# Patient Record
Sex: Male | Born: 1980 | Race: White | Hispanic: No | Marital: Single | Smoking: Current some day smoker
Health system: Southern US, Community
[De-identification: ages and names within clinical notes are randomized; demographics above are authoritative.]

## PROBLEM LIST (undated history)

## (undated) DIAGNOSIS — I10 Essential (primary) hypertension: Secondary | ICD-10-CM

---

## 2020-10-07 ENCOUNTER — Other Ambulatory Visit: Payer: Self-pay

## 2020-10-07 ENCOUNTER — Encounter: Payer: Self-pay | Admitting: Emergency Medicine

## 2020-10-07 ENCOUNTER — Ambulatory Visit
Admission: EM | Admit: 2020-10-07 | Discharge: 2020-10-07 | Disposition: A | Discharge status: Home / Self Care | Payer: 59 | Attending: Family Medicine | Admitting: Family Medicine

## 2020-10-07 DIAGNOSIS — I10 Essential (primary) hypertension: Secondary | ICD-10-CM | POA: Diagnosis not present

## 2020-10-07 DIAGNOSIS — M545 Low back pain, unspecified: Secondary | ICD-10-CM

## 2020-10-07 DIAGNOSIS — M5442 Lumbago with sciatica, left side: Secondary | ICD-10-CM

## 2020-10-07 HISTORY — DX: Essential (primary) hypertension: I10

## 2020-10-07 MED ORDER — CYCLOBENZAPRINE HCL 10 MG PO TABS: 10.0000 mg | ORAL_TABLET | Freq: Two times a day (BID) | ORAL | 0 refills | Status: AC | PRN

## 2020-10-07 MED ORDER — KETOROLAC TROMETHAMINE 30 MG/ML IJ SOLN
30.0000 mg | Freq: Once | INTRAMUSCULAR | Status: AC
  Administered 2020-10-07: 30 mg via INTRAMUSCULAR

## 2020-10-07 NOTE — ED Triage Notes (Signed)
Pt c/o of left lower back pain. Denies injury, but does report sneezing on Saturday causing pain to lower back.

## 2020-10-07 NOTE — Discharge Instructions (Addendum)
Treating you for your back pain that I believe is a pulled muscle  Toradol given here for pain I am sending a muscle relaxant to take as needed Heat to the back Stretching  Ibuprofen 600 mg and tylenol 1000 mg every 8 hours as needed.   Your blood pressure was very elevated today. 206/141.  Keep monitoring this at home.  Keep a log.  I am checking some basic lab work.  You would want to decrease sodium in your diet, stop smoking.  Decrease caffeine and any kind of stimulant. Drink water.  I have given the information in your packet about hypertension and things to help better control it. If your blood pressure remains elevated please give [place] a call in the next couple days and we will start you on a blood pressure medication.  I am also referring you to primary care

## 2020-10-08 LAB — CBC
Hematocrit: 45.5 % (ref 37.5–51.0)
Hemoglobin: 15.8 g/dL (ref 13.0–17.7)
MCH: 31.1 pg (ref 26.6–33.0)
MCHC: 34.7 g/dL (ref 31.5–35.7)
MCV: 90 fL (ref 79–97)
Platelets: 321 10*3/uL (ref 150–450)
RBC: 5.08 x10E6/uL (ref 4.14–5.80)
RDW: 12.3 % (ref 11.6–15.4)
WBC: 8.9 10*3/uL (ref 3.4–10.8)

## 2020-10-08 LAB — COMPREHENSIVE METABOLIC PANEL
ALT: 35 IU/L (ref 0–44)
AST: 25 IU/L (ref 0–40)
Albumin/Globulin Ratio: 1.6 (ref 1.2–2.2)
Albumin: 4.4 g/dL (ref 4.0–5.0)
Alkaline Phosphatase: 68 IU/L (ref 44–121)
BUN/Creatinine Ratio: 11 (ref 9–20)
BUN: 8 mg/dL (ref 6–24)
Bilirubin Total: 0.2 mg/dL (ref 0.0–1.2)
CO2: 20 mmol/L (ref 20–29)
Calcium: 8.8 mg/dL (ref 8.7–10.2)
Chloride: 105 mmol/L (ref 96–106)
Creatinine, Ser: 0.73 mg/dL — ABNORMAL LOW (ref 0.76–1.27)
GFR calc Af Amer: 134 mL/min/{1.73_m2} (ref >59)
GFR calc non Af Amer: 116 mL/min/{1.73_m2} (ref >59)
Globulin, Total: 2.7 g/dL (ref 1.5–4.5)
Glucose: 92 mg/dL (ref 65–99)
Potassium: 4 mmol/L (ref 3.5–5.2)
Sodium: 141 mmol/L (ref 134–144)
Total Protein: 7.1 g/dL (ref 6.0–8.5)

## 2020-10-08 NOTE — ED Provider Notes (Signed)
Jason Krueger    CSN: [id number] Arrival date & time: 10/07/20  1422      History   Chief Complaint Chief Complaint  Patient presents with  . Back Pain    HPI Jason Krueger is a 40 y.o. male.   Patient is a 40 year old male presents today for complaints of back pain.  This is located to the left lower back area.  Describes as sharp and pulling type pain.  Sneezed on Saturday and felt twinge in back.  Does do heavy lifting at work.  Denies any urinary symptoms, numbness, tingling or weakness.  The pain does somewhat radiate to the left upper thigh area.  History of sciatic nerve pain. Patient also very hypertensive today.  Had 206/141.  This was checked twice.  Has been told in the past that he had high blood pressure.  Was never started on any medication for high blood pressure.  Reporting headaches here and there.  No dizziness, trouble with vision, chest pain, shortness of breath. Current everyday smoker.      Past Medical History:  Diagnosis Date  . Hypertension     There are no problems to display for this patient.   History reviewed. No pertinent surgical history.     Home Medications    Prior to Admission medications   Medication Sig Start Date End Date Taking? Authorizing Provider  cyclobenzaprine (FLEXERIL) 10 MG tablet Take 1 tablet (10 mg total) by mouth 2 (two) times daily as needed for muscle spasms. 10/07/20  Yes Janace Aris, NP    Family History History reviewed. No pertinent family history.  Social History Social History   Tobacco Use  . Smoking status: Current Some Day Smoker    Packs/day: 1.00    Types: Cigarettes  . Smokeless tobacco: Never Used  Vaping Use  . Vaping Use: Never used     Allergies   Patient has no allergy information on record.   Review of Systems Review of Systems   Physical Exam Triage Vital Signs ED Triage Vitals  Enc Vitals Group     BP 10/07/20 1439 (!) 206/141     Pulse Rate 10/07/20 1439 (!) 102      Resp 10/07/20 1439 18     Temp 10/07/20 1439 98.7 F (37.1 C)     Temp Source 10/07/20 1439 Oral     SpO2 10/07/20 1439 97 %     Weight 10/07/20 1440 220 lb (99.8 kg)     Height 10/07/20 1440 5\' 7"  (1.702 m)     Head Circumference --      Peak Flow --      Pain Score 10/07/20 1439 5     Pain Loc --      Pain Edu? --      Excl. in GC? --    No data found.  Updated Vital Signs BP (!) 206/141 (BP Location: Left Arm)   Pulse (!) 102   Temp 98.7 F (37.1 C) (Oral)   Resp 18   Ht 5\' 7"  (1.702 m)   Wt 220 lb (99.8 kg)   SpO2 97%   BMI 34.46 kg/m   Visual Acuity Right Eye Distance:   Left Eye Distance:   Bilateral Distance:    Right Eye Near:   Left Eye Near:    Bilateral Near:     Physical Exam Vitals and nursing note reviewed.  Constitutional:      Appearance: Normal appearance.  HENT:  Head: Normocephalic and atraumatic.     Nose: Nose normal.  Eyes:     Conjunctiva/sclera: Conjunctivae normal.  Cardiovascular:     Rate and Rhythm: Normal rate and regular rhythm.  Pulmonary:     Effort: Pulmonary effort is normal.     Breath sounds: Normal breath sounds.  Musculoskeletal:        General: Normal range of motion.     Cervical back: Normal range of motion.       Back:     Comments: TTP   Skin:    General: Skin is warm and dry.  Neurological:     Mental Status: He is alert.  Psychiatric:        Mood and Affect: Mood normal.      UC Treatments / Results  Labs (all labs ordered are listed, but only abnormal results are displayed) Labs Reviewed  COMPREHENSIVE METABOLIC PANEL - Abnormal; Notable for the following components:      Result Value   Creatinine, Ser 0.73 (*)    All other components within normal limits   Narrative:    Performed at:  [address] [place], [place]  [id number] Lab Director: Jolene Schimke MD, Phone:  [phone]  CBC   Narrative:    Performed at:  [address] [place] [address]  [place], [place]  [id number] Lab Director: Jolene Schimke MD, Phone:  [phone]    EKG   Radiology No results found.  Procedures Procedures (including critical care time)  Medications Ordered in UC Medications  ketorolac (TORADOL) 30 MG/ML injection 30 mg (30 mg Intramuscular Given 10/07/20 1510)    Initial Impression / Assessment and Plan / UC Course  I have reviewed the triage vital signs and the nursing notes.  Pertinent labs & imaging results that were available during my care of the patient were reviewed by me and considered in my medical decision making (see chart for details).      Low back pain Most likely pulled muscle.  Toradol given here for pain. Sending in muscle relaxant to take as needed Recommend heat to the back and gentle stretching.  Ibuprofen or Tylenol for pain Work not provided   Essential hypertension Patient's blood pressure was very elevated today at 206/141.  Has been told he had high blood pressure in the past but never started on any medications for this. No concerning red flags today.  Checking some basic lab work to include kidney function. Recommended diet changes and decrease stimulants to include caffeine and nicotine. Recommend purchase a blood pressure cuff over-the-counter and monitor blood pressures at home and keep a log. If blood pressures remain elevated over the next couple days call back and we can start on blood pressure medication if needed. I have put a request in for primary care Follow up as needed for continued or worsening symptoms  Final Clinical Impressions(s) / UC Diagnoses   Final diagnoses:  Essential hypertension  Acute left-sided low back pain without sciatica     Discharge Instructions     Treating you for your back pain that I believe is a pulled muscle  Toradol given here for pain I am sending a muscle relaxant to take as needed Heat to the back Stretching  Ibuprofen 600 mg and tylenol 1000 mg every 8  hours as needed.   Your blood pressure was very elevated today. 206/141.  Keep monitoring this at home.  Keep a log.  I am checking some basic  lab work.  You would want to decrease sodium in your diet, stop smoking.  Decrease caffeine and any kind of stimulant. Drink water.  I have given the information in your packet about hypertension and things to help better control it. If your blood pressure remains elevated please give [place] a call in the next couple days and we will start you on a blood pressure medication.  I am also referring you to primary care      ED Prescriptions    Medication Sig Dispense Auth. Provider   cyclobenzaprine (FLEXERIL) 10 MG tablet Take 1 tablet (10 mg total) by mouth 2 (two) times daily as needed for muscle spasms. 20 tablet Dahlia Byes A, NP     PDMP not reviewed this encounter.   Dahlia Byes A, NP 10/08/20 [phone]

## 2020-10-09 ENCOUNTER — Emergency Department
Admission: EM | Admit: 2020-10-09 | Discharge: 2020-10-09 | Disposition: A | Discharge status: Home / Self Care | Payer: 59 | Attending: Emergency Medicine | Admitting: Emergency Medicine

## 2020-10-09 ENCOUNTER — Emergency Department: Payer: 59

## 2020-10-09 ENCOUNTER — Other Ambulatory Visit: Payer: Self-pay

## 2020-10-09 ENCOUNTER — Encounter: Payer: Self-pay | Admitting: Emergency Medicine

## 2020-10-09 DIAGNOSIS — F17210 Nicotine dependence, cigarettes, uncomplicated: Secondary | ICD-10-CM | POA: Diagnosis not present

## 2020-10-09 DIAGNOSIS — M5417 Radiculopathy, lumbosacral region: Secondary | ICD-10-CM

## 2020-10-09 DIAGNOSIS — S3992XA Unspecified injury of lower back, initial encounter: Secondary | ICD-10-CM | POA: Diagnosis present

## 2020-10-09 DIAGNOSIS — W182XXA Fall in (into) shower or empty bathtub, initial encounter: Secondary | ICD-10-CM | POA: Insufficient documentation

## 2020-10-09 DIAGNOSIS — R030 Elevated blood-pressure reading, without diagnosis of hypertension: Secondary | ICD-10-CM

## 2020-10-09 DIAGNOSIS — S39012A Strain of muscle, fascia and tendon of lower back, initial encounter: Secondary | ICD-10-CM

## 2020-10-09 DIAGNOSIS — I10 Essential (primary) hypertension: Secondary | ICD-10-CM | POA: Insufficient documentation

## 2020-10-09 MED ORDER — HYDROCODONE-ACETAMINOPHEN 5-325 MG PO TABS: 1.0000 | ORAL_TABLET | Freq: Three times a day (TID) | ORAL | 0 refills | Status: AC | PRN

## 2020-10-09 MED ORDER — AMLODIPINE BESYLATE 5 MG PO TABS: 5.0000 mg | ORAL_TABLET | Freq: Every day | ORAL | 3 refills | Status: AC

## 2020-10-09 MED ORDER — PREDNISONE 20 MG PO TABS: 20.0000 mg | ORAL_TABLET | Freq: Two times a day (BID) | ORAL | 0 refills | Status: AC

## 2020-10-09 MED ORDER — LIDOCAINE 5 % EX PTCH: 1.0000 | MEDICATED_PATCH | Freq: Two times a day (BID) | CUTANEOUS | 0 refills | Status: AC | PRN

## 2020-10-09 NOTE — ED Notes (Signed)
Discharge instructions reviewed with pt. Pt calm , collective, ambulated without assistance
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
## 2020-10-09 NOTE — ED Notes (Signed)
PA in with pt 

## 2020-10-09 NOTE — Discharge Instructions (Addendum)
Your exam and CT scan are reassuring at this time.  No signs of any acute herniated disc or significant nerve impingement.  Your symptoms are consistent with some disc bulging at L5-S1.  Your symptoms should respond to medication as prescribed.  Select and follow-up with a local primary provider for ongoing management of your blood pressure.  Return to the ED if needed.

## 2020-10-09 NOTE — ED Notes (Addendum)
Pt states he has been told he has HTN in the past, didn't have insurance at the time, so never followed up for bp meds. Prefers to sit in the bed vs laying down at this time.

## 2020-10-09 NOTE — ED Triage Notes (Signed)
Pt comes into the ED via POV c/o low back pain that started last Saturday after a sneeze.  Pt ambulatory with good gait.  Pt in NAD at this time.

## 2020-10-09 NOTE — ED Notes (Signed)
Pt placed in room, no distress at this time.

## 2021-07-15 IMAGING — CT CT L SPINE W/O CM
3 of 4 series · 13 of 33 positions shown, 16 images · non-contrast
Comparison: None.

CLINICAL DATA: Low back pain since [REDACTED].

EXAM:
CT LUMBAR SPINE WITHOUT CONTRAST
TECHNIQUE: Multidetector CT imaging of the lumbar spine was performed without
intravenous contrast administration. Multiplanar CT image
reconstructions were also generated.

[Series 7: sagittal bone · sagittal · 0.39mm/px · 5 of 78 slices shown, 6 images]
[im 26/78  bone]
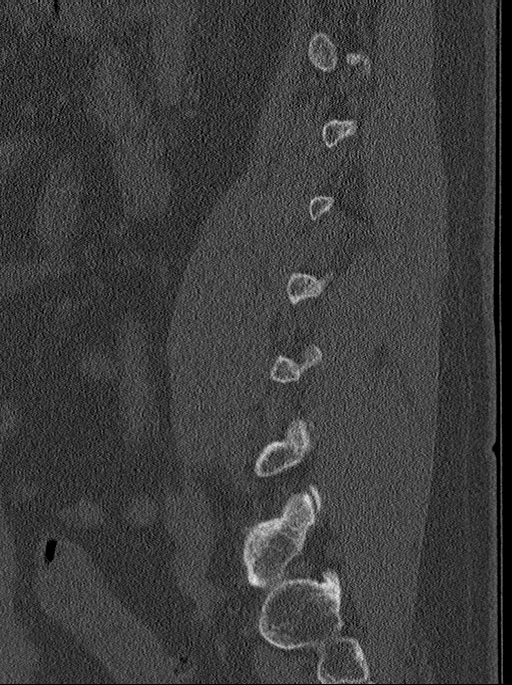
[im 33/78  bone]
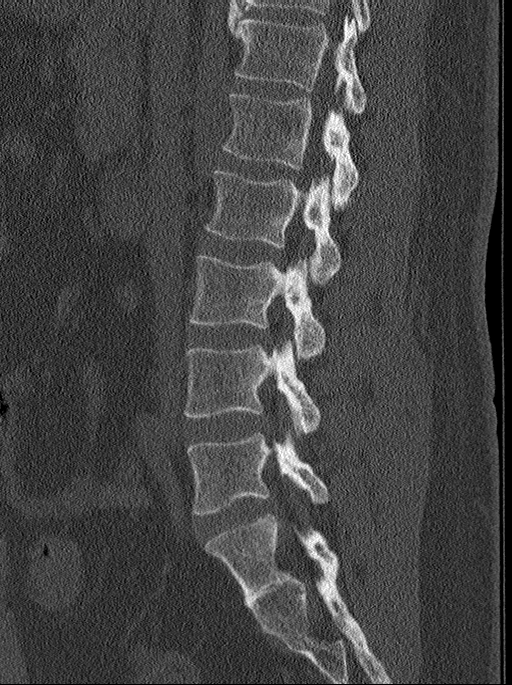
[im 39/78  soft-tissue]
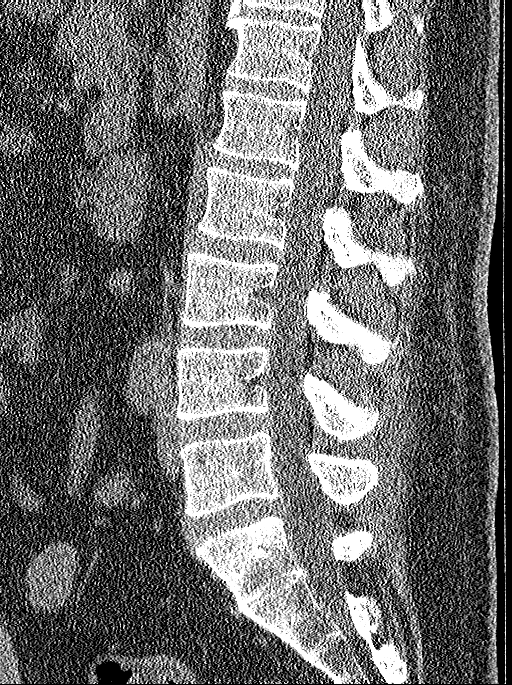
[im 39/78  bone]
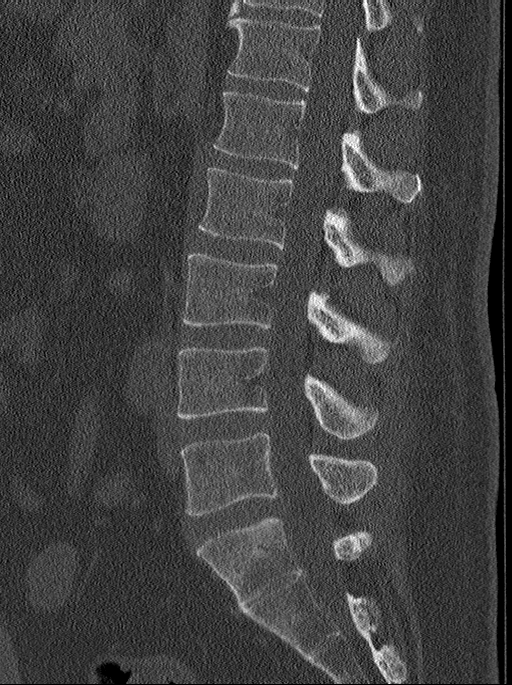
[im 45/78  bone]
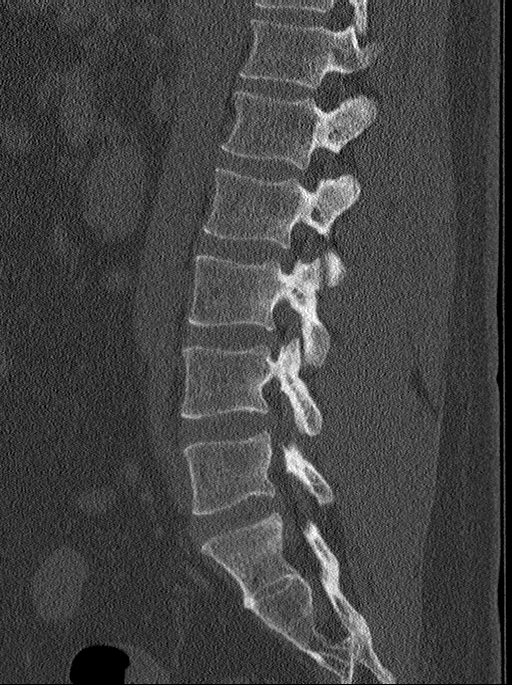
[im 52/78  bone]
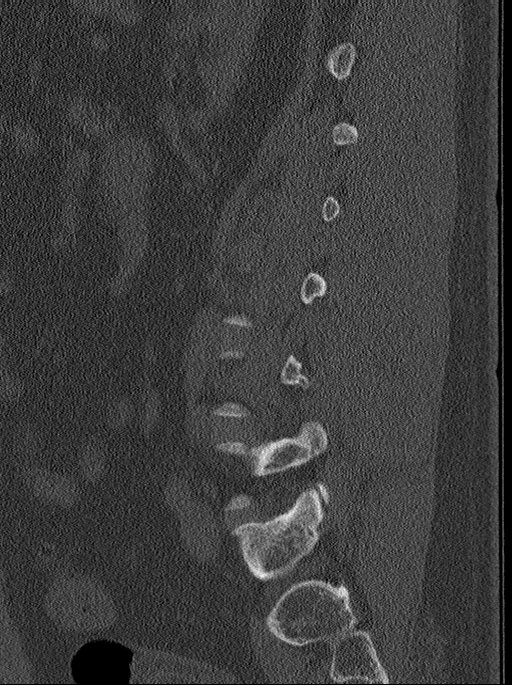

[Series 8: coronal bone · coronal · 0.39mm/px · 3 of 90 slices shown]
[im 18/90  bone]
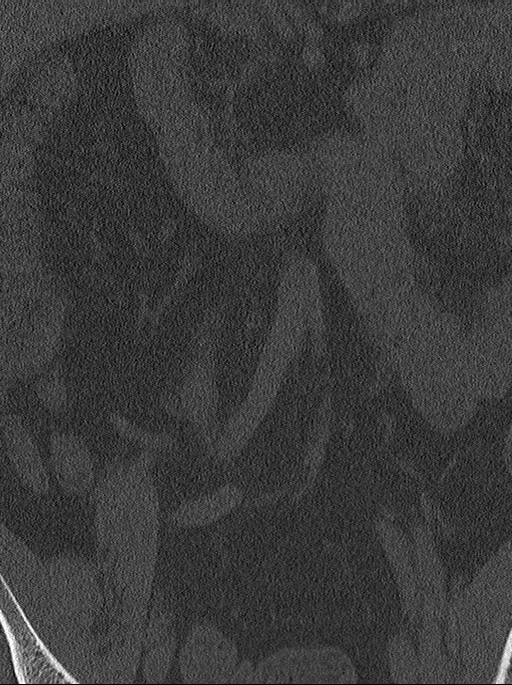
[im 36/90  bone]
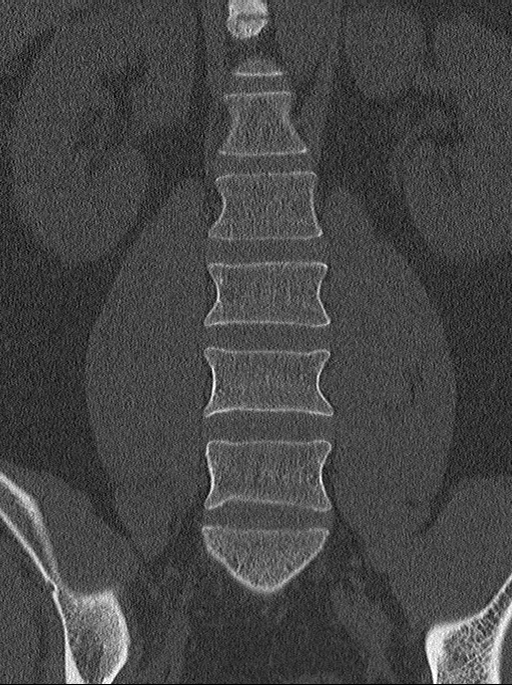
[im 54/90  bone]
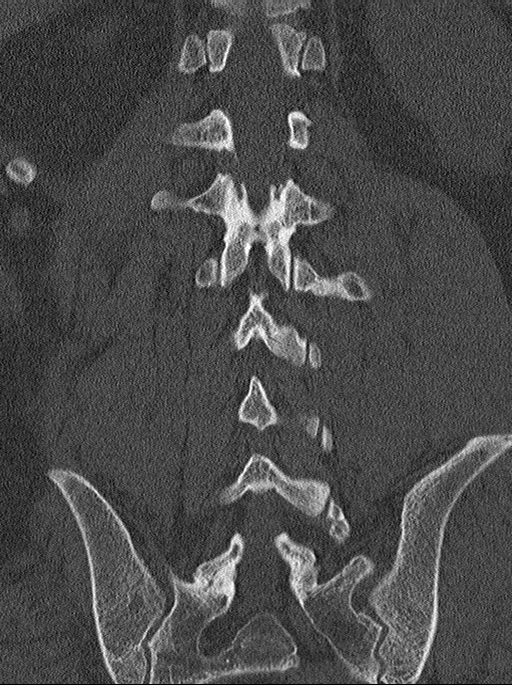

[Series 9: multi disc · axial · 0.21mm/px · z∈[-368,-192]mm · 5 of 121 slices shown, 7 images]
[im 21/121  soft-tissue]
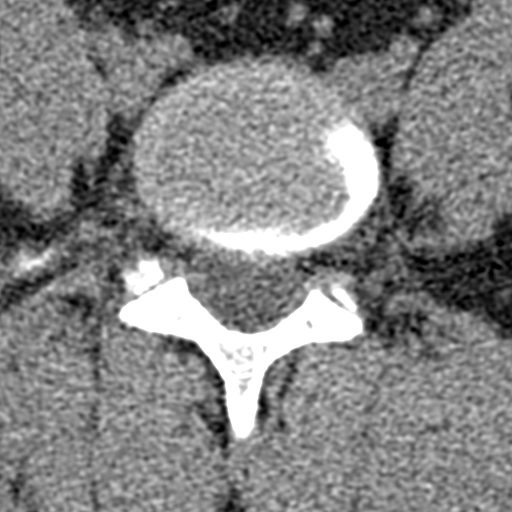
[im 21/121  bone]
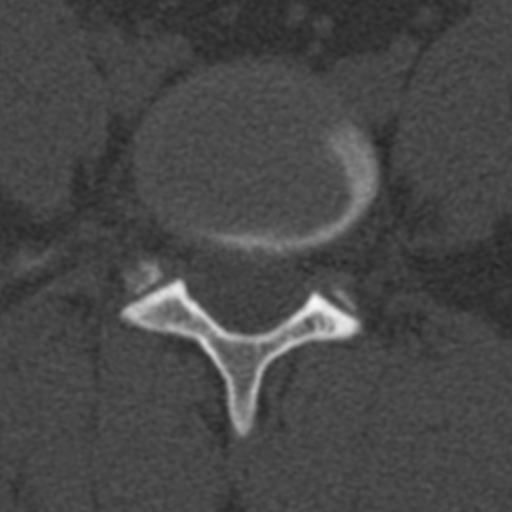
[im 41/121  bone]
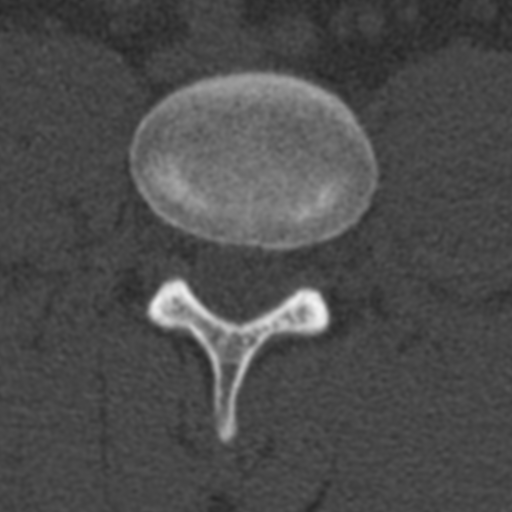
[im 61/121  bone]
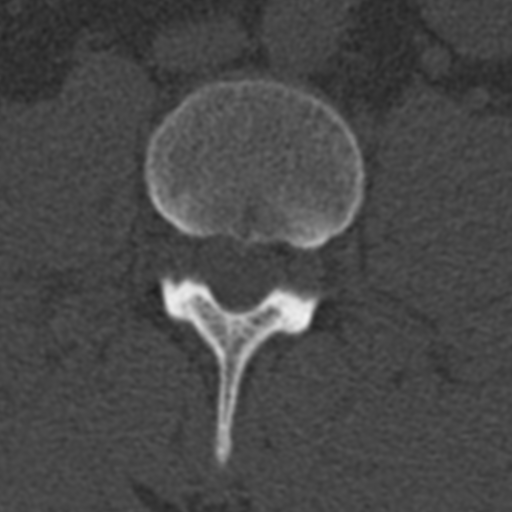
[im 81/121  bone]
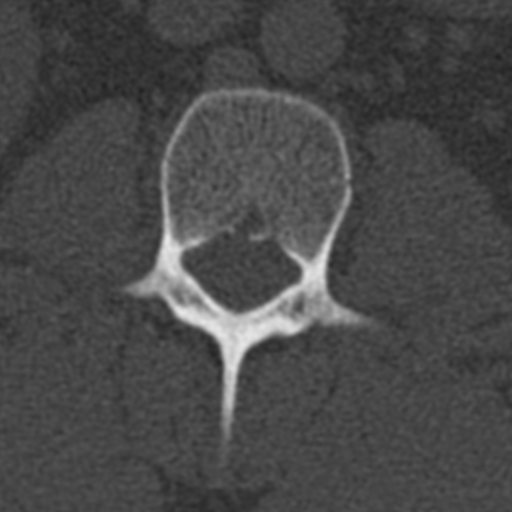
[im 101/121  soft-tissue]
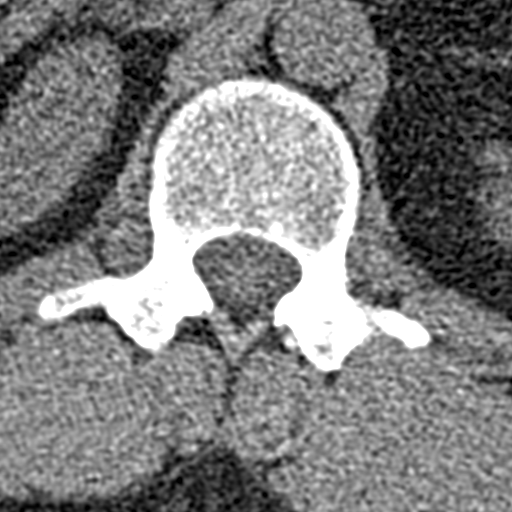
[im 101/121  bone]
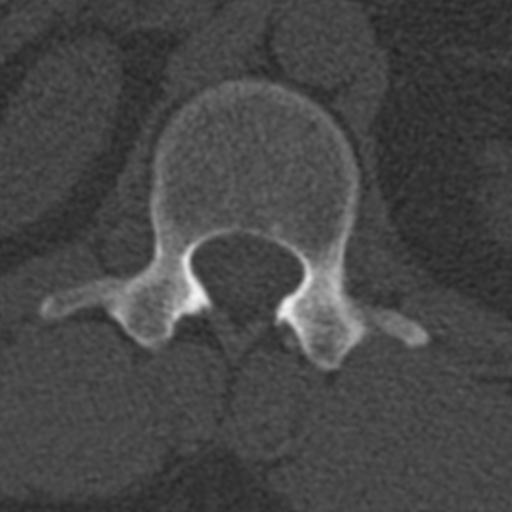

[13 of 33 positions shown; findings below may reference images not displayed]

FINDINGS: Segmentation: There are five lumbar type vertebral bodies. The last
full intervertebral disc space is labeled L5-S1.

Alignment: Normal

Vertebrae: Normal appearance. No fractures or bone lesions. The
facets are normally aligned. No pars defects.

Paraspinal and other soft tissues: No significant findings. No
paraspinal hematoma or retroperitoneal process.

Disc levels: No lumbar disc protrusions, spinal or foraminal
stenosis. There is a diffuse bulging annulus at L5-S1 with mild
impression on the ventral thecal sac. No foraminal stenosis.
IMPRESSION: 1. Normal alignment and no acute bony findings.
2. Diffuse bulging annulus at L5-S1 with mild impression on the
ventral thecal sac. No foraminal stenosis.
3. The other disc space levels are unremarkable.
